# Patient Record
Sex: Female | Born: 1975 | Race: Asian | Hispanic: No | Marital: Married
Health system: Southern US, Community
[De-identification: ages and names within clinical notes are randomized; demographics above are authoritative.]

---

## 2018-12-07 ENCOUNTER — Other Ambulatory Visit: Payer: Self-pay | Admitting: Pediatric Intensive Care

## 2018-12-07 DIAGNOSIS — Z20822 Contact with and (suspected) exposure to covid-19: Secondary | ICD-10-CM

## 2018-12-11 LAB — NOVEL CORONAVIRUS, NAA: SARS-CoV-2, NAA: NOT DETECTED

## 2018-12-12 ENCOUNTER — Telehealth: Payer: Self-pay

## 2018-12-12 NOTE — Telephone Encounter (Signed)
Pt given results. Expressed understanding.

## 2018-12-13 NOTE — Telephone Encounter (Signed)
Pt called and stated that she needs a note from work that states that she has tested negative for covid-19. Please advise   Cb#[phone]

## 2018-12-13 NOTE — Telephone Encounter (Signed)
Attempted to call pt. To advise that we can provide negative test results for her to give to her employer.  rec'd voice message and not able to leave message, due to mailbox being full.

## 2020-02-29 ENCOUNTER — Other Ambulatory Visit: Payer: Self-pay | Admitting: Internal Medicine

## 2020-02-29 DIAGNOSIS — Z8349 Family history of other endocrine, nutritional and metabolic diseases: Secondary | ICD-10-CM

## 2020-02-29 DIAGNOSIS — E042 Nontoxic multinodular goiter: Secondary | ICD-10-CM

## 2020-03-07 ENCOUNTER — Ambulatory Visit
Admission: RE | Admit: 2020-03-07 | Discharge: 2020-03-07 | Disposition: A | Discharge status: Home / Self Care | Payer: 59 | Source: Ambulatory Visit | Attending: Internal Medicine | Admitting: Internal Medicine

## 2020-03-07 DIAGNOSIS — E042 Nontoxic multinodular goiter: Secondary | ICD-10-CM

## 2020-03-07 DIAGNOSIS — Z8349 Family history of other endocrine, nutritional and metabolic diseases: Secondary | ICD-10-CM

## 2020-06-24 ENCOUNTER — Other Ambulatory Visit: Payer: 59

## 2020-06-24 DIAGNOSIS — Z20822 Contact with and (suspected) exposure to covid-19: Secondary | ICD-10-CM

## 2020-06-27 LAB — SARS-COV-2, NAA 2 DAY TAT

## 2020-06-27 LAB — NOVEL CORONAVIRUS, NAA: SARS-CoV-2, NAA: DETECTED — AB

## 2021-05-02 ENCOUNTER — Other Ambulatory Visit (HOSPITAL_BASED_OUTPATIENT_CLINIC_OR_DEPARTMENT_OTHER): Payer: Self-pay | Admitting: Internal Medicine

## 2021-05-02 DIAGNOSIS — Z1231 Encounter for screening mammogram for malignant neoplasm of breast: Secondary | ICD-10-CM

## 2021-06-10 ENCOUNTER — Other Ambulatory Visit: Payer: Self-pay

## 2021-06-10 ENCOUNTER — Encounter (HOSPITAL_BASED_OUTPATIENT_CLINIC_OR_DEPARTMENT_OTHER): Payer: Self-pay

## 2021-06-10 ENCOUNTER — Ambulatory Visit (HOSPITAL_BASED_OUTPATIENT_CLINIC_OR_DEPARTMENT_OTHER)
Admission: RE | Admit: 2021-06-10 | Discharge: 2021-06-10 | Disposition: A | Discharge status: Home / Self Care | Payer: 59 | Source: Ambulatory Visit | Attending: Internal Medicine | Admitting: Internal Medicine

## 2021-06-10 DIAGNOSIS — Z1231 Encounter for screening mammogram for malignant neoplasm of breast: Secondary | ICD-10-CM | POA: Insufficient documentation

## 2021-06-12 ENCOUNTER — Other Ambulatory Visit: Payer: Self-pay | Admitting: Internal Medicine

## 2021-06-12 ENCOUNTER — Other Ambulatory Visit (HOSPITAL_BASED_OUTPATIENT_CLINIC_OR_DEPARTMENT_OTHER): Payer: Self-pay | Admitting: Internal Medicine

## 2021-06-12 DIAGNOSIS — R928 Other abnormal and inconclusive findings on diagnostic imaging of breast: Secondary | ICD-10-CM

## 2021-08-08 ENCOUNTER — Other Ambulatory Visit: Payer: Self-pay | Admitting: Internal Medicine

## 2021-08-08 ENCOUNTER — Ambulatory Visit
Admission: RE | Admit: 2021-08-08 | Discharge: 2021-08-08 | Disposition: A | Discharge status: Home / Self Care | Payer: 59 | Source: Ambulatory Visit | Attending: Internal Medicine | Admitting: Internal Medicine

## 2021-08-08 DIAGNOSIS — R928 Other abnormal and inconclusive findings on diagnostic imaging of breast: Secondary | ICD-10-CM

## 2021-08-11 ENCOUNTER — Other Ambulatory Visit: Payer: 59

## 2021-08-15 ENCOUNTER — Other Ambulatory Visit: Payer: Self-pay

## 2021-08-15 ENCOUNTER — Ambulatory Visit
Admission: RE | Admit: 2021-08-15 | Discharge: 2021-08-15 | Disposition: A | Discharge status: Home / Self Care | Payer: 59 | Source: Ambulatory Visit | Attending: Internal Medicine | Admitting: Internal Medicine

## 2021-08-15 DIAGNOSIS — R928 Other abnormal and inconclusive findings on diagnostic imaging of breast: Secondary | ICD-10-CM

## 2021-08-20 ENCOUNTER — Other Ambulatory Visit: Payer: Self-pay | Admitting: Internal Medicine

## 2021-08-20 DIAGNOSIS — N63 Unspecified lump in unspecified breast: Secondary | ICD-10-CM

## 2021-08-20 DIAGNOSIS — Z09 Encounter for follow-up examination after completed treatment for conditions other than malignant neoplasm: Secondary | ICD-10-CM

## 2022-09-16 ENCOUNTER — Other Ambulatory Visit: Payer: Self-pay | Admitting: Internal Medicine

## 2022-09-16 DIAGNOSIS — N920 Excessive and frequent menstruation with regular cycle: Secondary | ICD-10-CM

## 2022-09-16 IMAGING — MG MM BREAST LOCALIZATION CLIP
4 series · 4 of 12 positions shown · non-contrast
Comparison: Previous exam(s).

CLINICAL DATA: Post procedure mammogram for clip placement

EXAM:
3D DIAGNOSTIC RIGHT MAMMOGRAM POST ULTRASOUND BIOPSY

[R CC synth-2D]
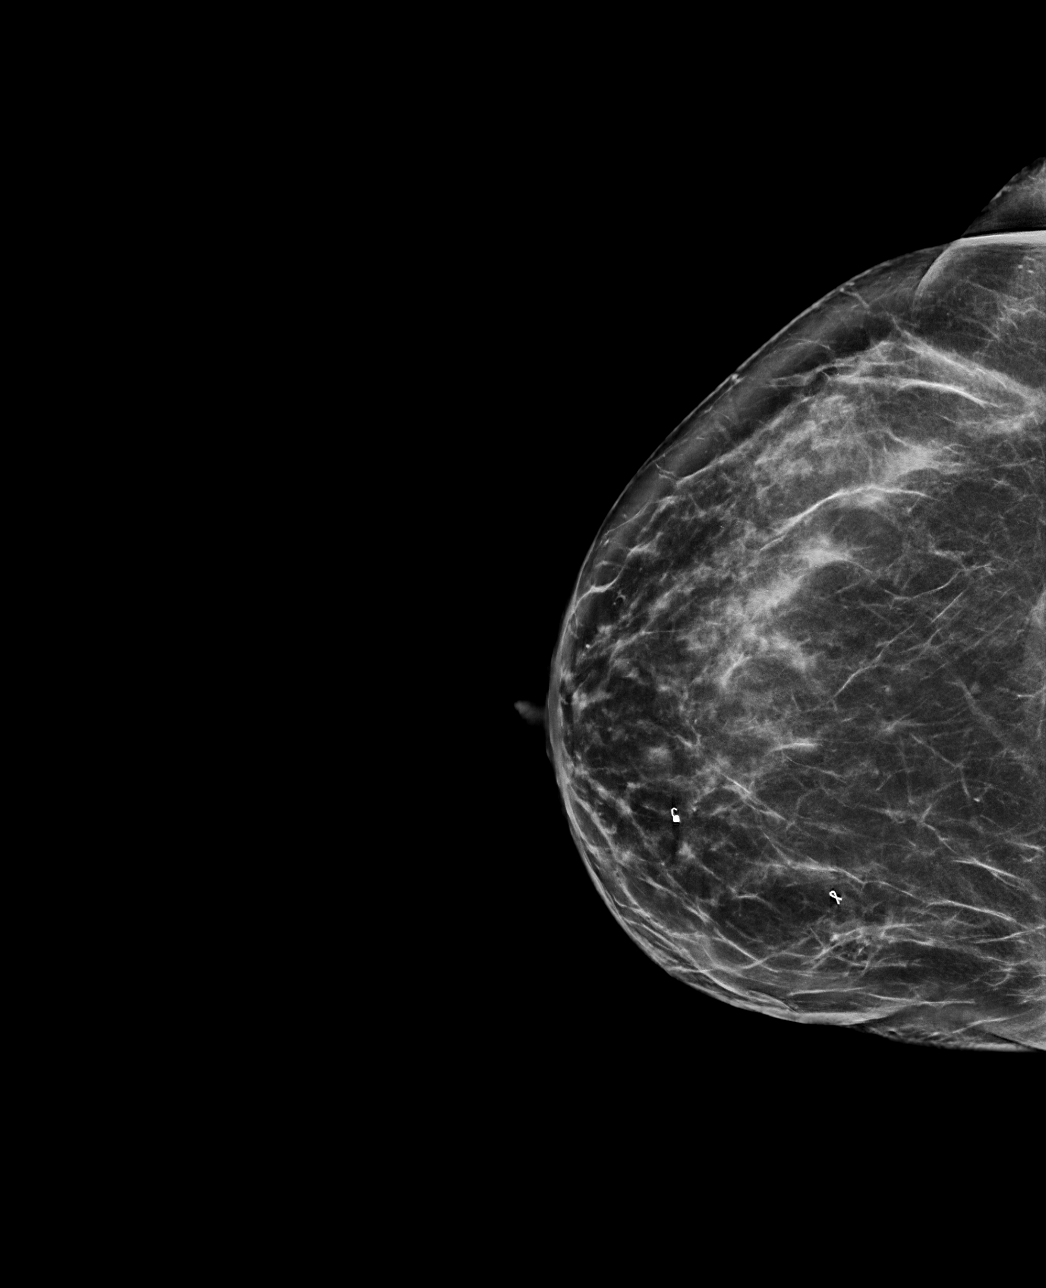

[R ML synth-2D]
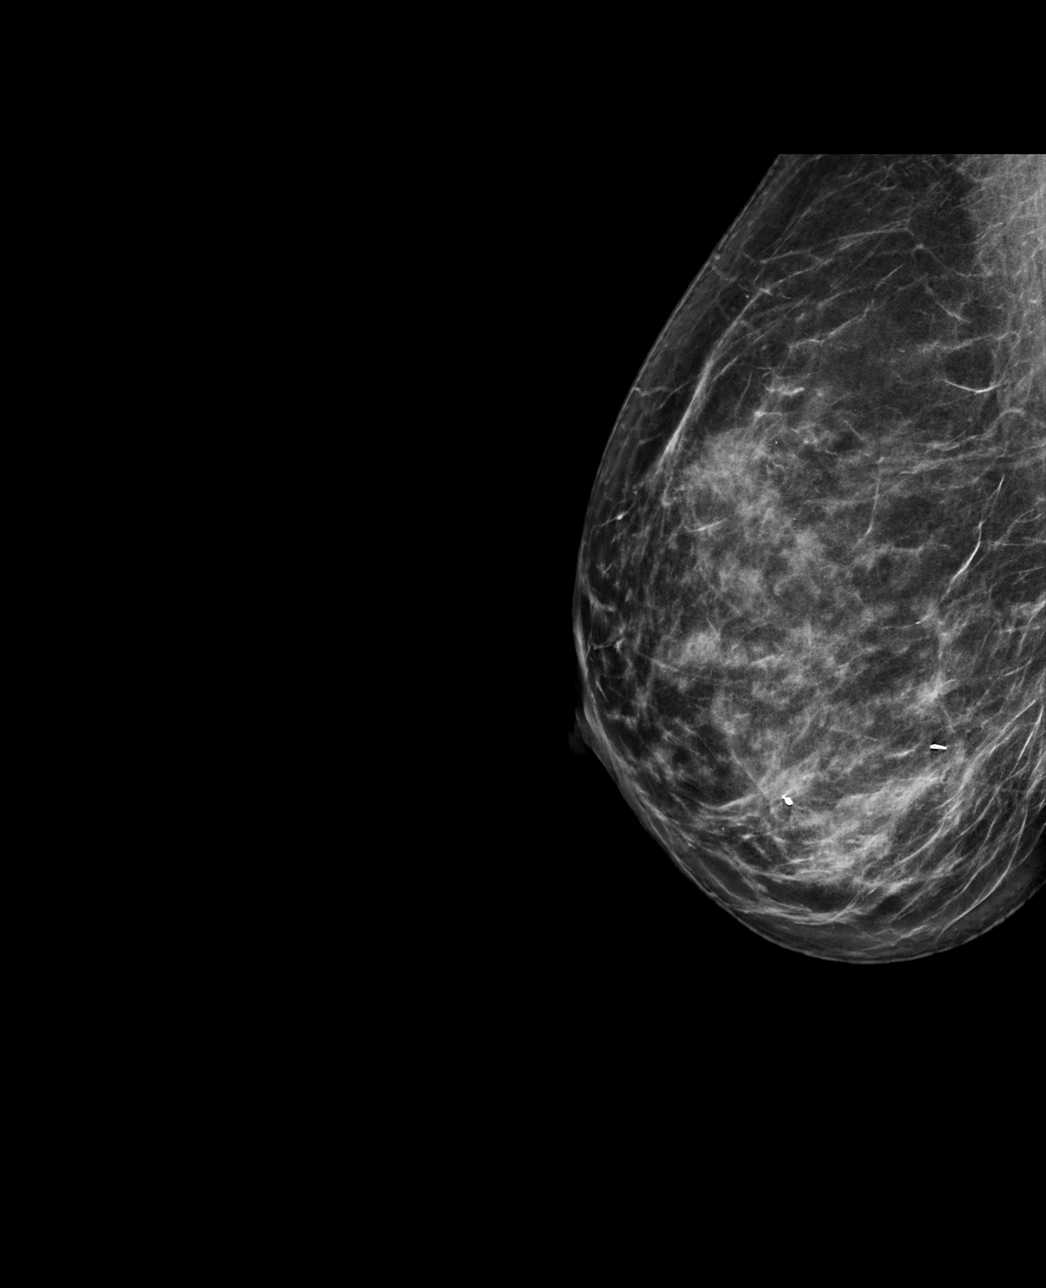

[R CC tomo · tomo slice 41/82.0]
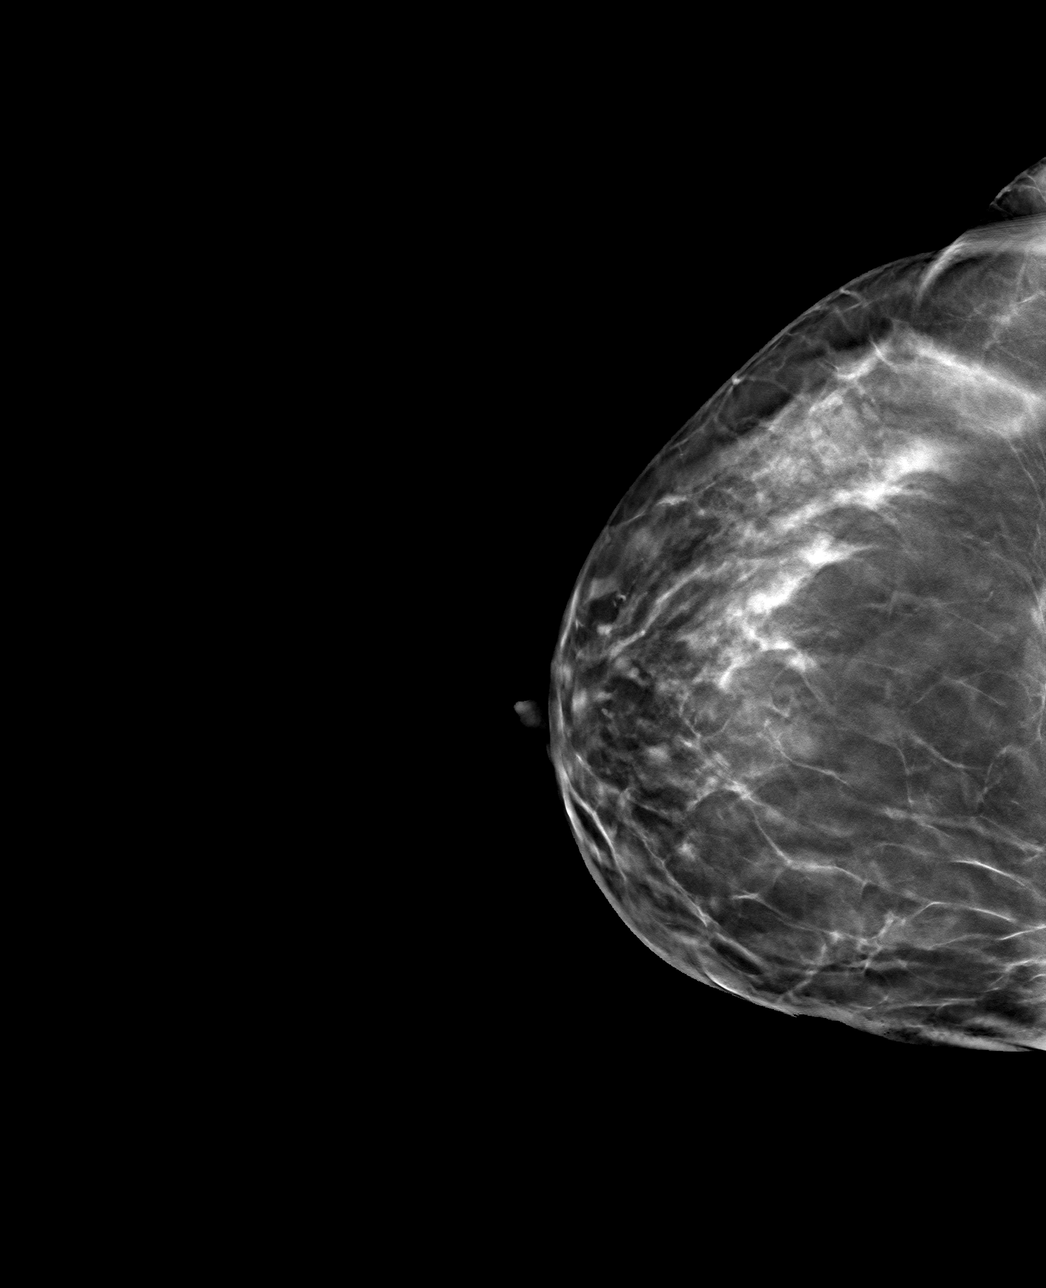

[R ML tomo · tomo slice 39/78.0]
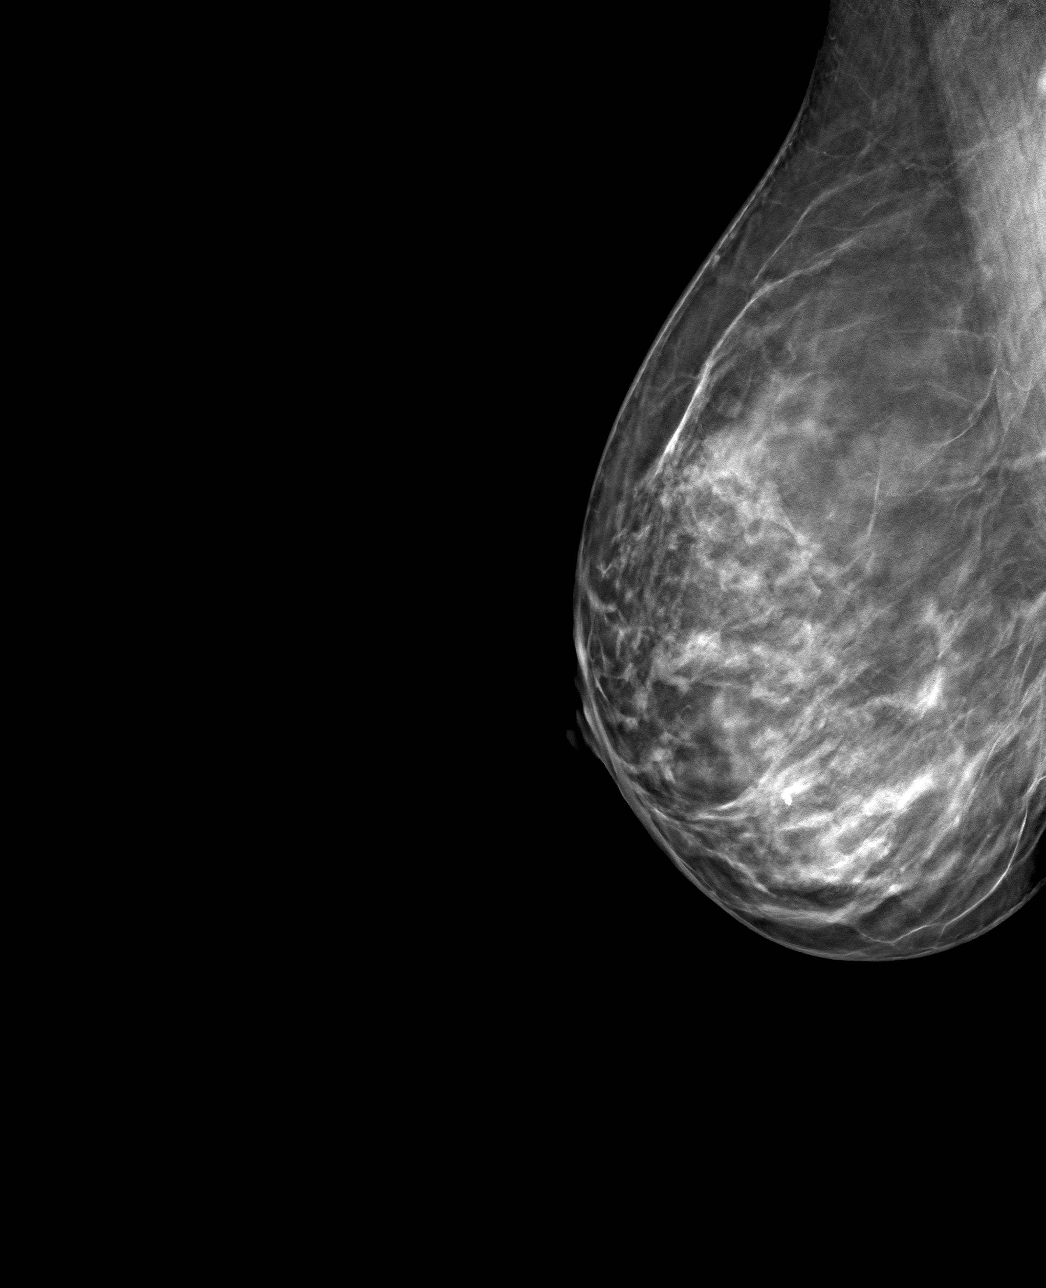

[4 of 12 positions shown; findings below may reference images not displayed]

FINDINGS: 3D Mammographic images were obtained following ultrasound guided
biopsy of a mass in the right breast at 4 o'clock 3 cm from the
nipple. The ribbon biopsy marking clip is in expected position at
the site of biopsy.

3D Mammographic images were obtained following ultrasound guided
biopsy of a mass in the right breast at 4 o'clock retroareolar. The
coil biopsy marking clip is in expected position at the site of
biopsy.
IMPRESSION: Appropriate positioning of the ribbon shaped biopsy marking clip at
the site of biopsy in the right breast at 4 o'clock 3 cm from the
nipple.

Appropriate positioning of the coil shaped biopsy marking clip at
the site of biopsy in the right breast at 4 o'clock retroareolar.

Final Assessment: Post Procedure Mammograms for Marker Placement

## 2022-10-09 ENCOUNTER — Ambulatory Visit
Admission: RE | Admit: 2022-10-09 | Discharge: 2022-10-09 | Disposition: A | Discharge status: Home / Self Care | Payer: 59 | Source: Ambulatory Visit | Attending: Internal Medicine | Admitting: Internal Medicine

## 2022-10-09 DIAGNOSIS — N920 Excessive and frequent menstruation with regular cycle: Secondary | ICD-10-CM
